# Patient Record
Sex: Male | Born: 1985 | Race: White | Hispanic: No | Marital: Married
Health system: Southern US, Community
[De-identification: ages and names within clinical notes are randomized; demographics above are authoritative.]

---

## 2008-03-01 ENCOUNTER — Ambulatory Visit: Payer: Self-pay | Admitting: Pediatrics

## 2009-11-29 IMAGING — CT CT HEAD WITHOUT CONTRAST
2 series · 16 of 30 positions shown, 20 images · non-contrast
Comparison: none

REASON FOR EXAM: Headache
         Call report to: [phone]
COMMENTS:

[Series 2: without · axial · non-contrast · 0.47mm/px · z∈[+294,+428]mm · 13 of 33 slices shown, 17 images]
[im 3/33  brain]
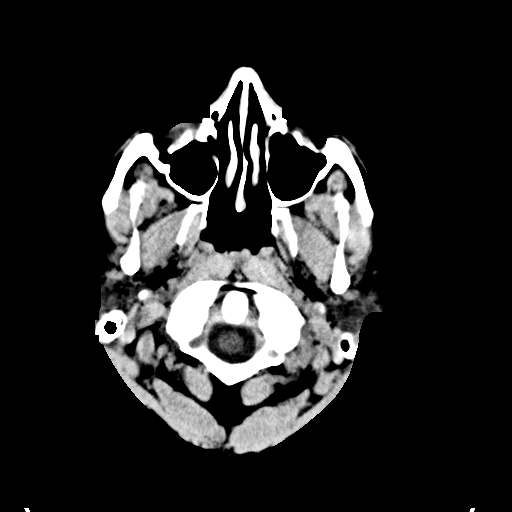
[im 3/33  bone]
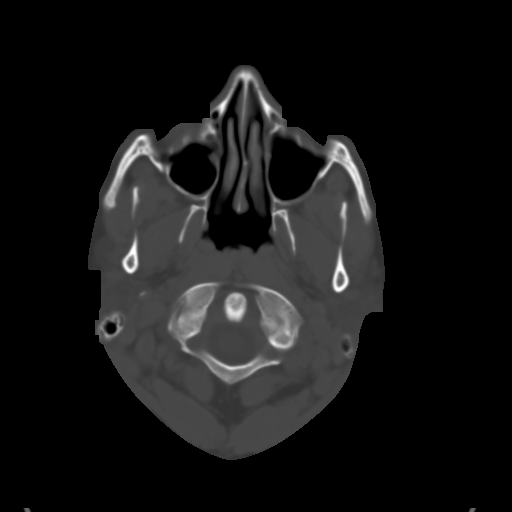
[im 5/33  brain]
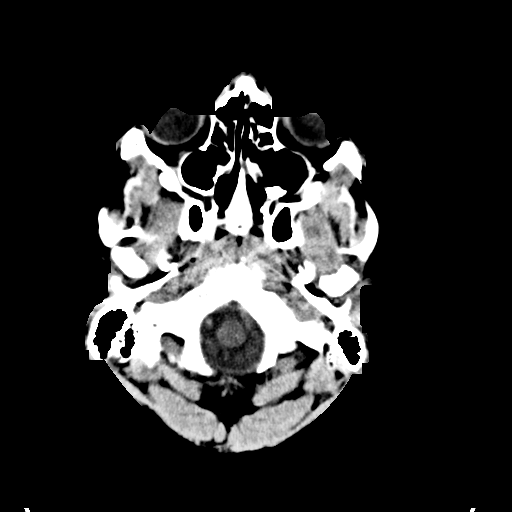
[im 7/33  brain]
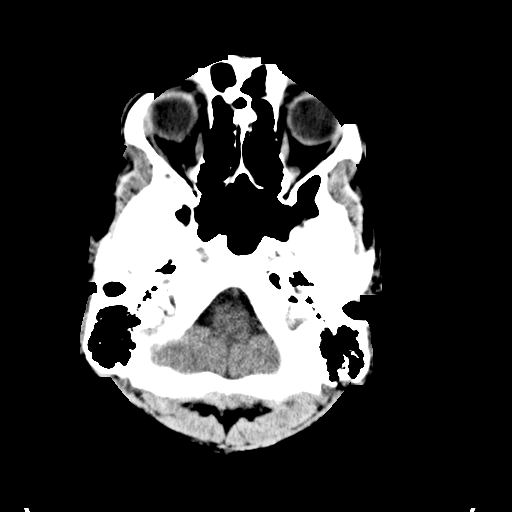
[im 10/33  brain]
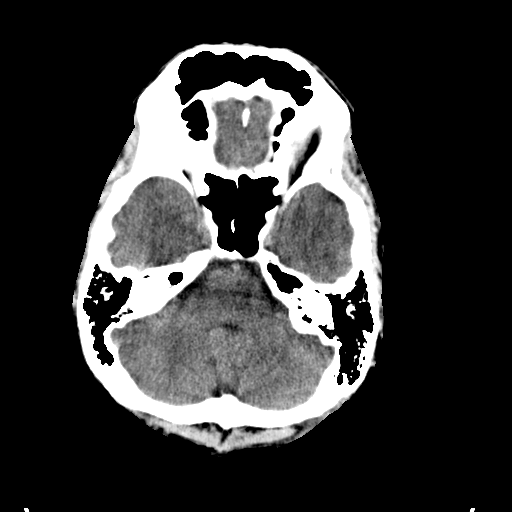
[im 12/33  brain]
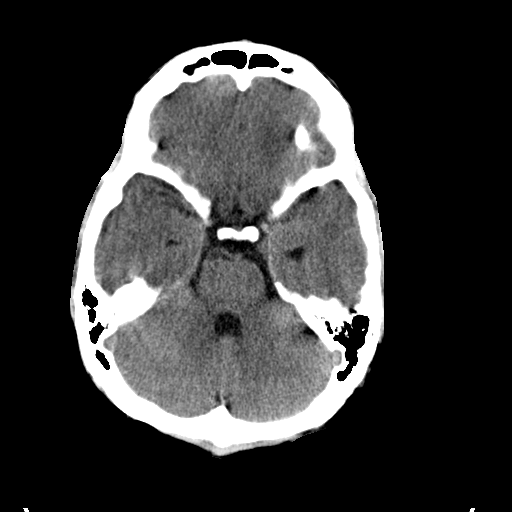
[im 12/33  bone]
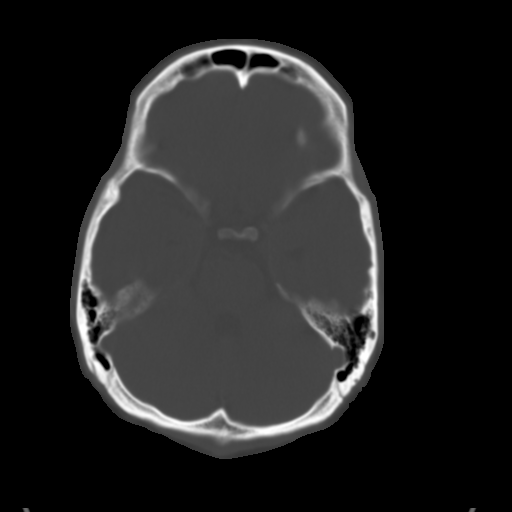
[im 14/33  brain]
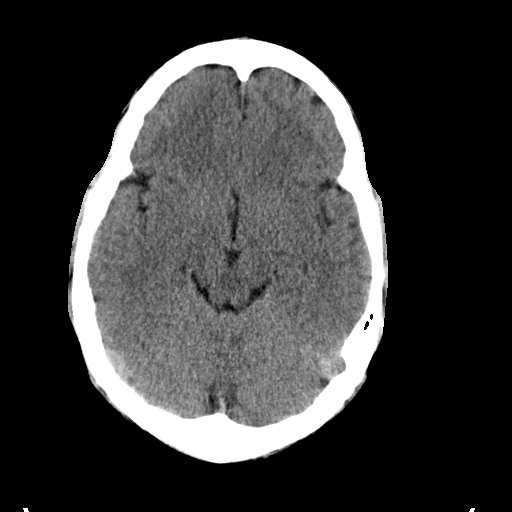
[im 17/33  brain]
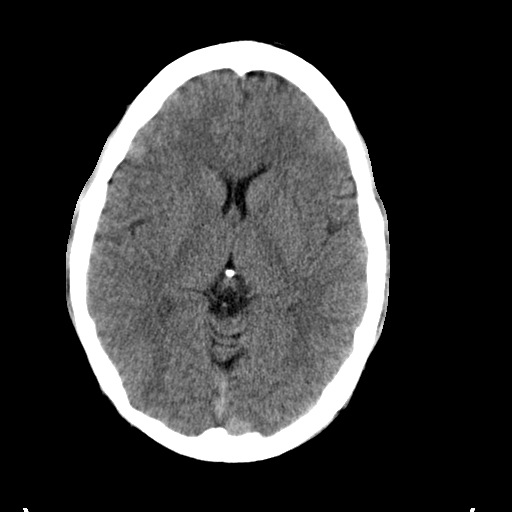
[im 19/33  brain]
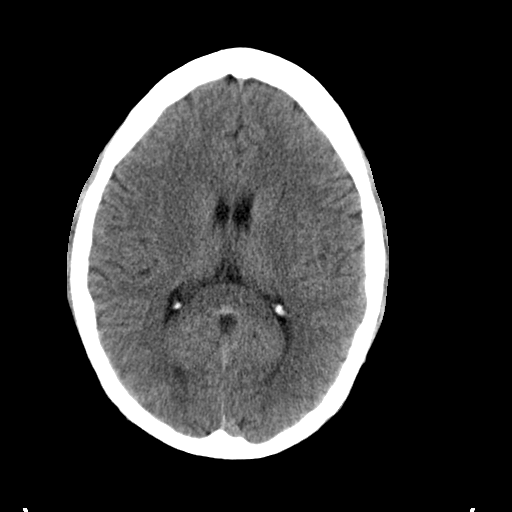
[im 21/33  brain]
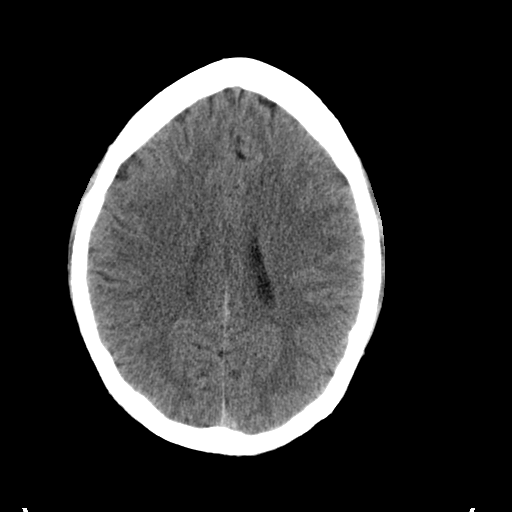
[im 21/33  bone]
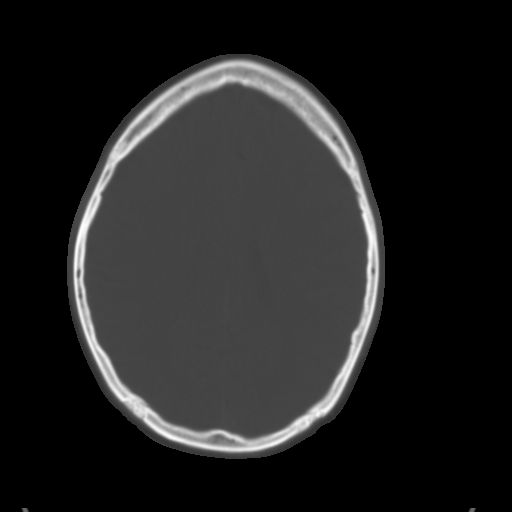
[im 23/33  brain]
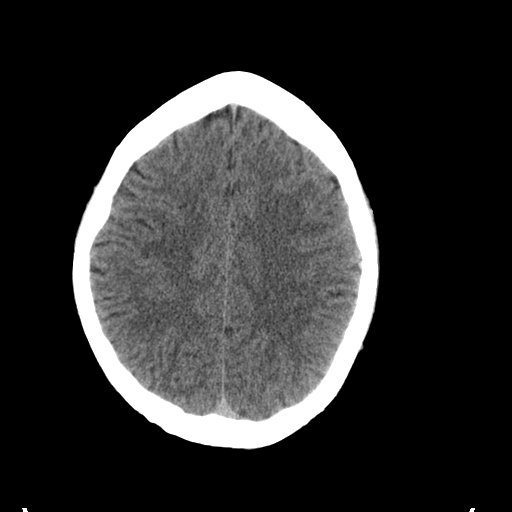
[im 26/33  brain]
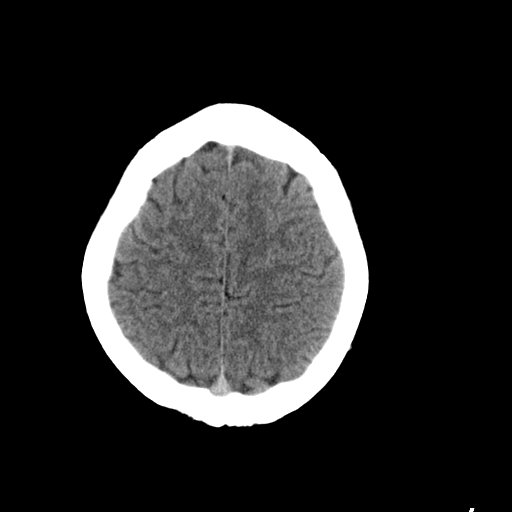
[im 28/33  brain]
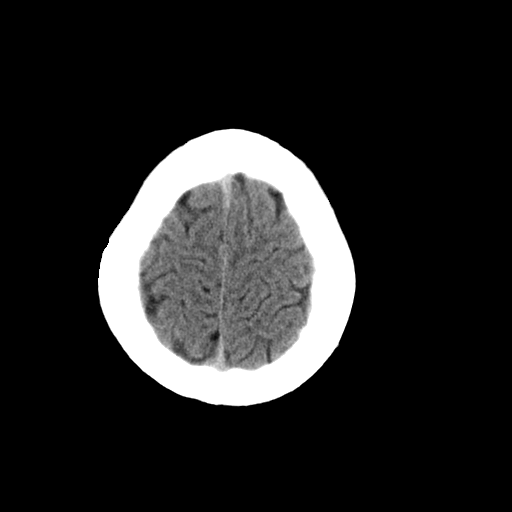
[im 30/33  brain]
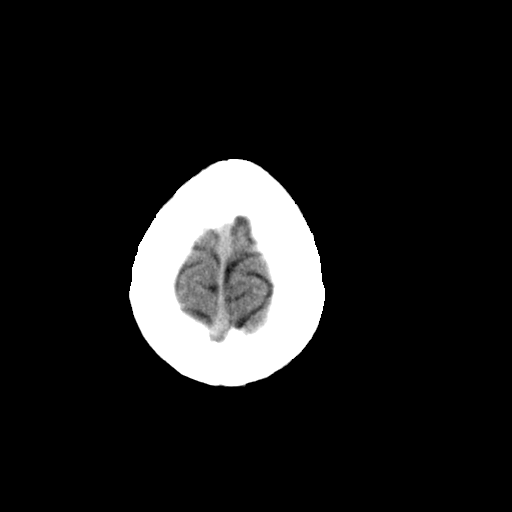
[im 30/33  bone]
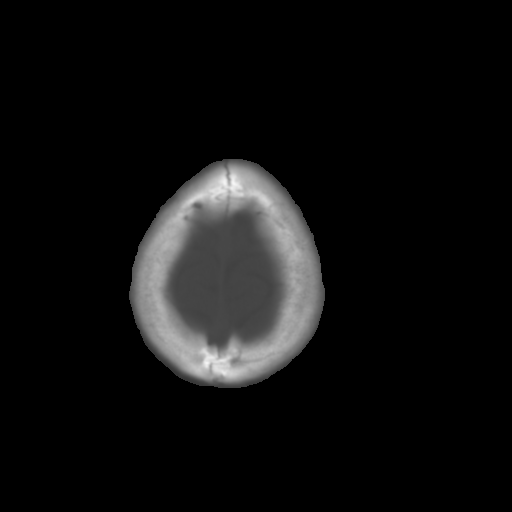

[Series 3: bone · axial · 0.47mm/px · z∈[+294,+338]mm · 3 of 33 slices shown]
[im 3/33  bone]
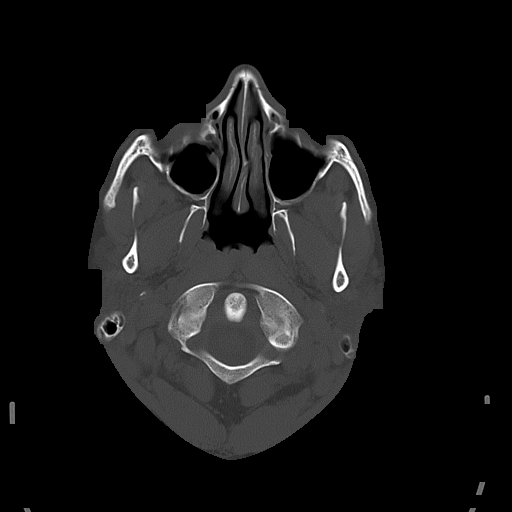
[im 7/33  bone]
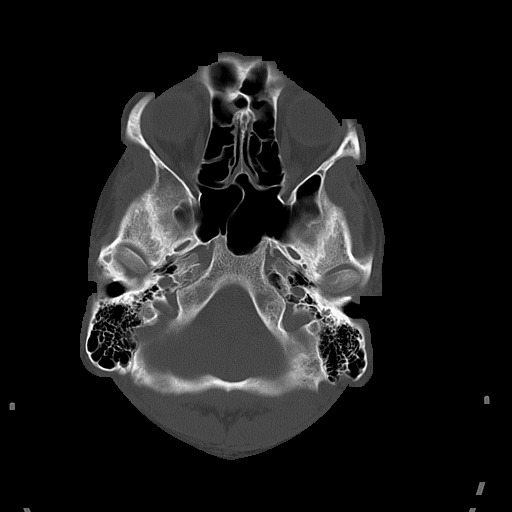
[im 12/33  bone]
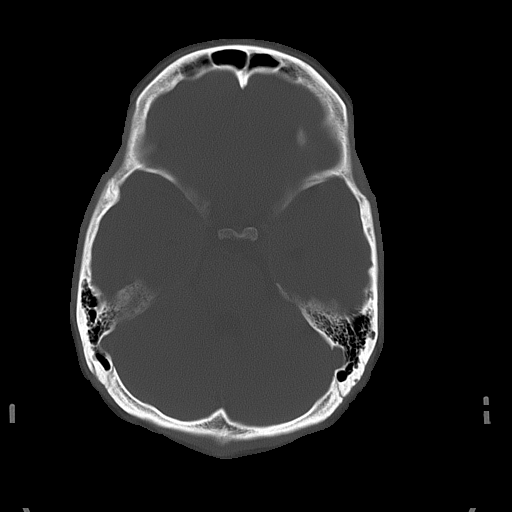

[16 of 30 positions shown; findings below may reference images not displayed]

PROCEDURE:     CT  - CT HEAD WITHOUT CONTRAST  - March 01, 2008  [DATE]

RESULT:     There is no evidence of intra-axial or extra-axial fluid
collections. There is no evidence of acute hemorrhage or secondary signs
reflecting mass effect or subacute or chronic infarction. The visualized
bony skeleton is evaluated and there is no evidence of depressed skull
fracture or further fracture or dislocation. The visualized mastoid air
cells are clear. The ventricles, cisterns and sulci are symmetric and
patent.
IMPRESSION: 1.     No evidence of acute intracranial abnormalities as described above.
2.     If there is persistent clinical concern, further evaluation with
brain MRI is recommended, if clinically warranted.

## 2017-02-18 DIAGNOSIS — R002 Palpitations: Secondary | ICD-10-CM | POA: Insufficient documentation

## 2022-03-01 NOTE — Progress Notes (Unsigned)
[name] [address] [place] [address] Phone: [phone] Subjective:   Bruce Donath, am serving as a scribe for Dr. Antoine Primas.  I'm seeing this patient by the request  of:  Pcp, No  CC: Low back pain  [id number]  Brendan [name] is a 36 y.o. male coming in with complaint of back pain. Patient states that he is having R sided thoracic spine pain. Two weeks ago he was exercising and his pain progressively got worse. Pain has improved but is still present. Using TENS and trying to work on [employer].      Social History   Socioeconomic History   Marital status: Married    Spouse name: Not on file   Number of children: Not on file   Years of education: Not on file   Highest education level: Not on file  Occupational History   Not on file  Tobacco Use   Smoking status: Not on file   Smokeless tobacco: Not on file  Substance and Sexual Activity   Alcohol use: Not on file   Drug use: Not on file   Sexual activity: Not on file  Other Topics Concern   Not on file  Social History Narrative   Not on file   Social Determinants of Health   Financial Resource Strain: Not on file  Food Insecurity: Not on file  Transportation Needs: Not on file  Physical Activity: Not on file  Stress: Not on file  Social Connections: Not on file   Not on File No family history on file.   Current Outpatient Medications (Cardiovascular):    sildenafil (VIAGRA) 100 MG tablet, Take by mouth.     Current Outpatient Medications (Other):    tiZANidine (ZANAFLEX) 2 MG tablet, Take 1 tablet (2 mg total) by mouth at bedtime as needed for muscle spasms.    Reviewed prior external information including notes and imaging from  primary care provider As well as notes that were available from care everywhere and other healthcare systems.  Past medical history, social, surgical and family history all reviewed in electronic medical record.  No  pertanent information unless stated regarding to the chief complaint.   Review of Systems:  No headache, visual changes, nausea, vomiting, diarrhea, constipation, dizziness, abdominal pain, skin rash, fevers, chills, night sweats, weight loss, swollen lymph nodes, body aches, joint swelling, chest pain, shortness of breath, mood changes. POSITIVE muscle aches  Objective  Blood pressure 122/76, pulse 61, height 6\' 1"  (1.854 m), SpO2 99 %.   General: No apparent distress alert and oriented x3 mood and affect normal, dressed appropriately.  HEENT: Pupils equal, extraocular movements intact  Respiratory: Patient's speak in full sentences and does not appear short of breath  Cardiovascular: No lower extremity edema, non tender, no erythema  Patient does have some loss of lordosis of the lumbar spine.  Tightness noted in the paraspinal musculature right greater than left.  Tightness with straight leg test but no true radicular symptoms.  Patient has difficulty with flexion extension.  No midline tenderness.  5 out of 5 strength of the lower extremities  Osteopathic findings C4 flexed rotated and side bent left C6 flexed rotated and side bent left T8 extended rotated and side bent left inhaled third rib T9 extended rotated and side bent left L2 flexed rotated and side bent right Sacrum right on right  [id number]; 15 additional minutes spent for Therapeutic exercises as stated in above notes.  This included  exercises focusing on stretching, strengthening, with significant focus on eccentric aspects.   Long term goals include an improvement in range of motion, strength, endurance as well as avoiding reinjury. Patient's frequency would include in 1-2 times a day, 3-5 times a week for a duration of 6-12 weeks. Low back exercises that included:  Pelvic tilt/bracing instruction to focus on control of the pelvic girdle and lower abdominal muscles  Glute strengthening exercises, focusing on proper firing of the  glutes without engaging the low back muscles Proper stretching techniques for maximum relief for the hamstrings, hip flexors, low back and some rotation where tolerated  Proper technique shown and discussed handout in great detail with ATC.  All questions were discussed and answered.     Impression and Recommendations:    The above documentation has been reviewed and is accurate and complete Judi Saa, DO

## 2022-03-02 ENCOUNTER — Ambulatory Visit (INDEPENDENT_AMBULATORY_CARE_PROVIDER_SITE_OTHER): Payer: BC Managed Care – PPO | Admitting: Family Medicine

## 2022-03-02 VITALS — BP 122/76 | HR 61 | Ht 73.0 in

## 2022-03-02 DIAGNOSIS — M24551 Contracture, right hip: Secondary | ICD-10-CM | POA: Diagnosis not present

## 2022-03-02 DIAGNOSIS — M9903 Segmental and somatic dysfunction of lumbar region: Secondary | ICD-10-CM

## 2022-03-02 DIAGNOSIS — M9904 Segmental and somatic dysfunction of sacral region: Secondary | ICD-10-CM | POA: Diagnosis not present

## 2022-03-02 DIAGNOSIS — M9901 Segmental and somatic dysfunction of cervical region: Secondary | ICD-10-CM | POA: Diagnosis not present

## 2022-03-02 DIAGNOSIS — M9902 Segmental and somatic dysfunction of thoracic region: Secondary | ICD-10-CM

## 2022-03-02 DIAGNOSIS — M9908 Segmental and somatic dysfunction of rib cage: Secondary | ICD-10-CM | POA: Diagnosis not present

## 2022-03-02 MED ORDER — TIZANIDINE HCL 2 MG PO TABS: 2.0000 mg | ORAL_TABLET | Freq: Every evening | ORAL | 0 refills | Status: AC | PRN

## 2022-03-02 NOTE — Assessment & Plan Note (Signed)
More likely hip flexor tightness.  Patient has no other systemic problems.  Seems to be tight on the right side more than the left.  Given Zanaflex to take at bedtime.  New problem overall but did respond well to osteopathic manipulation.  Follow-up again in 6 weeks.  Work with Event organiser to learn home exercises.

## 2022-03-02 NOTE — Assessment & Plan Note (Signed)

## 2022-03-02 NOTE — Patient Instructions (Signed)
Do prescribed exercises at least 3x a week: View at www.my-exercise-code.com using code: [id number]  Zanaflex 2mg  for next 3 nights then as needed 3 ibuprofen 3x a day for 3 days See you again in 5-6 weeks

## 2022-04-06 NOTE — Progress Notes (Unsigned)
  [name] [address] [place] [address] Phone: [phone] Subjective:   Brendan [name], am serving as a scribe for Dr. Antoine Primas.   CC: back and neck pain   [name]  Brendan [name] is a 36 y.o. male coming in with complaint of back and neck pain. OMT 8/1/12023. Patient states doing well. Feels much better since last visit. No new complaints.   Medications patient has been prescribed: Zanaflex  Taking:         Review of Systems:  No headache, visual changes, nausea, vomiting, diarrhea, constipation, dizziness, abdominal pain, skin rash, fevers, chills, night sweats, weight loss, swollen lymph nodes, body aches, joint swelling, chest pain, shortness of breath, mood changes. POSITIVE muscle aches  Objective  Blood pressure 122/78, pulse 64, height 6\' 1"  (1.854 m), weight 194 lb (88 kg), SpO2 98 %.   General: No apparent distress alert and oriented x3 mood and affect normal, dressed appropriately.  HEENT: Pupils equal, extraocular movements intact  Respiratory: Patient's speak in full sentences and does not appear short of breath  Cardiovascular: No lower extremity edema, non tender, no erythema  Gait MSK:  Back low back exam does have loss   Osteopathic findings   T3 extended rotated and side bent righ L2 flexed rotated and side bent right Sacrum right on right       Assessment and Plan:  Hip flexor tightness, right Continues to have tightness but very.  Continues to make improvement.  Does have a low-dose of Zanaflex for breakthrough pain.  Follow-up again 2 to 3 months.    Nonallopathic problems  Decision today to treat with OMT was based on Physical Exam  After verbal consent patient was treated with HVLA, ME, FPR techniques in  thoracic, lumbar, and sacral  areas  Patient tolerated the procedure well with improvement in symptoms  Patient given exercises, stretches and lifestyle modifications  See  medications in patient instructions if given  Patient will follow up in 4-8 weeks    The above documentation has been reviewed and is accurate and complete , DO          Note: This dictation was prepared with Dragon dictation along with smaller phrase technology. Any transcriptional errors that result from this process are unintentional.

## 2022-04-12 ENCOUNTER — Ambulatory Visit: Payer: BC Managed Care – PPO | Admitting: Family Medicine

## 2022-04-12 VITALS — BP 122/78 | HR 64 | Ht 73.0 in | Wt 194.0 lb

## 2022-04-12 DIAGNOSIS — M24551 Contracture, right hip: Secondary | ICD-10-CM | POA: Diagnosis not present

## 2022-04-12 DIAGNOSIS — M9904 Segmental and somatic dysfunction of sacral region: Secondary | ICD-10-CM

## 2022-04-12 DIAGNOSIS — M9902 Segmental and somatic dysfunction of thoracic region: Secondary | ICD-10-CM | POA: Diagnosis not present

## 2022-04-12 DIAGNOSIS — M9903 Segmental and somatic dysfunction of lumbar region: Secondary | ICD-10-CM | POA: Diagnosis not present

## 2022-04-12 NOTE — Patient Instructions (Addendum)
Good to see you! Keep kicking butt

## 2022-04-12 NOTE — Assessment & Plan Note (Signed)
Continues to have tightness but very.  Continues to make improvement.  Does have a low-dose of Zanaflex for breakthrough pain.  Follow-up again 2 to 3 months.

## 2022-06-09 NOTE — Progress Notes (Signed)
  [name] [address] [place] [address] Phone: [phone] Subjective:    I'm seeing this patient by the request  of:  Pcp, No  CC: Low back pain follow-up  [id number]  Brendan [name] is a 36 y.o. male coming in with complaint of back and neck pain. OMT 04/12/2022. Patient states that he has been doing well.  Very minimal discomfort at all.  Patient has not been using the muscle relaxer on a regular basis.  Patient states that he continues to have some discomfort but no pain.  Medications patient has been prescribed: Zanaflex  Taking:         Reviewed prior external information including notes and imaging from previsou exam, outside providers and external EMR if available.   As well as notes that were available from care everywhere and other healthcare systems.  Past medical history, social, surgical and family history all reviewed in electronic medical record.  No pertanent information unless stated regarding to the chief complaint.   No past medical history on file.  Not on File   Review of Systems:  No headache, visual changes, nausea, vomiting, diarrhea, constipation, dizziness, abdominal pain, skin rash, fevers, chills, night sweats, weight loss, swollen lymph nodes, body aches, joint swelling, chest pain, shortness of breath, mood changes. POSITIVE muscle aches  Objective  Blood pressure 102/66, pulse 68, height 6\' 1"  (1.854 m), weight 196 lb (88.9 kg), SpO2 97 %.   General: No apparent distress alert and oriented x3 mood and affect normal, dressed appropriately.  HEENT: Pupils equal, extraocular movements intact  Respiratory: Patient's speak in full sentences and does not appear short of breath  Cardiovascular: No lower extremity edema, non tender, no erythema  Low back pain is noted with some very mild tenderness.  Still has tightness in the right hip flexor compared to the left side.  No numbness or tingling though  noted.  Osteopathic findings  C3 flexed rotated and side bent right C7 flexed rotated and side bent left T3 extended rotated and side bent right inhaled rib T9 extended rotated and side bent left L2 flexed rotated and side bent right Sacrum right on right       Assessment and Plan:  Hip flexor tightness, right Continue aspirin nothing severe.  Has made significant progress.  Patient is doing home exercises and follow-up with me again in 3 months    Nonallopathic problems  Decision today to treat with OMT was based on Physical Exam  After verbal consent patient was treated with HVLA, ME, FPR techniques in cervical, rib, thoracic, lumbar, and sacral  areas  Patient tolerated the procedure well with improvement in symptoms  Patient given exercises, stretches and lifestyle modifications  See medications in patient instructions if given  Patient will follow up in 4-8 weeks     The above documentation has been reviewed and is accurate and complete , DO         Note: This dictation was prepared with Dragon dictation along with smaller phrase technology. Any transcriptional errors that result from this process are unintentional.

## 2022-06-15 ENCOUNTER — Ambulatory Visit: Payer: BC Managed Care – PPO | Admitting: Family Medicine

## 2022-06-15 VITALS — BP 102/66 | HR 68 | Ht 73.0 in | Wt 196.0 lb

## 2022-06-15 DIAGNOSIS — M9904 Segmental and somatic dysfunction of sacral region: Secondary | ICD-10-CM

## 2022-06-15 DIAGNOSIS — M9908 Segmental and somatic dysfunction of rib cage: Secondary | ICD-10-CM | POA: Diagnosis not present

## 2022-06-15 DIAGNOSIS — M9903 Segmental and somatic dysfunction of lumbar region: Secondary | ICD-10-CM

## 2022-06-15 DIAGNOSIS — M9901 Segmental and somatic dysfunction of cervical region: Secondary | ICD-10-CM | POA: Diagnosis not present

## 2022-06-15 DIAGNOSIS — M24551 Contracture, right hip: Secondary | ICD-10-CM

## 2022-06-15 DIAGNOSIS — M9902 Segmental and somatic dysfunction of thoracic region: Secondary | ICD-10-CM

## 2022-06-15 NOTE — Patient Instructions (Signed)
See me in 3 months

## 2022-06-15 NOTE — Assessment & Plan Note (Signed)
Continue aspirin nothing severe.  Has made significant progress.  Patient is doing home exercises and follow-up with me again in 3 months

## 2022-09-10 NOTE — Progress Notes (Deleted)
  [facility] Phone: [phone] Subjective:    I'm seeing this patient by the request  of:  Pcp, No  CC:   [id number]  Raynold Blankenbaker is a 37 y.o. male coming in with complaint of back and neck pain. OMT 06/15/2022. Patient states   Medications patient has been prescribed: None  Taking:         Reviewed prior external information including notes and imaging from previsou exam, outside providers and external EMR if available.   As well as notes that were available from care everywhere and other healthcare systems.  Past medical history, social, surgical and family history all reviewed in electronic medical record.  No pertanent information unless stated regarding to the chief complaint.   No past medical history on file.  Not on File   Review of Systems:  No headache, visual changes, nausea, vomiting, diarrhea, constipation, dizziness, abdominal pain, skin rash, fevers, chills, night sweats, weight loss, swollen lymph nodes, body aches, joint swelling, chest pain, shortness of breath, mood changes. POSITIVE muscle aches  Objective  There were no vitals taken for this visit.   General: No apparent distress alert and oriented x3 mood and affect normal, dressed appropriately.  HEENT: Pupils equal, extraocular movements intact  Respiratory: Patient's speak in full sentences and does not appear short of breath  Cardiovascular: No lower extremity edema, non tender, no erythema  Gait MSK:  Back   Osteopathic findings  C2 flexed rotated and side bent right C6 flexed rotated and side bent left T3 extended rotated and side bent right inhaled rib T9 extended rotated and side bent left L2 flexed rotated and side bent right Sacrum right on right       Assessment and Plan:  No problem-specific Assessment & Plan notes found for this encounter.    Nonallopathic problems  Decision today to treat  with OMT was based on Physical Exam  After verbal consent patient was treated with HVLA, ME, FPR techniques in cervical, rib, thoracic, lumbar, and sacral  areas  Patient tolerated the procedure well with improvement in symptoms  Patient given exercises, stretches and lifestyle modifications  See medications in patient instructions if given  Patient will follow up in 4-8 weeks             Note: This dictation was prepared with Dragon dictation along with smaller phrase technology. Any transcriptional errors that result from this process are unintentional.

## 2022-09-14 ENCOUNTER — Ambulatory Visit: Payer: BC Managed Care – PPO | Admitting: Family Medicine

## 2022-09-14 NOTE — Progress Notes (Unsigned)
  [facility] [place] [facility] [place] Phone: [phone] Subjective:   Brendan [name], am serving as a scribe for Dr. Hulan Saas.  I'm seeing this patient by the request  of:  Pcp, [name]  CC: Low back pain follow-up  [id number]  Brendan [name] is a 37 y.o. male coming in with complaint of back and neck pain. OMT 06/15/2022. Patient states that L side of lumbar spine has been tight due to lifting his kids.  Patient states that overall he is doing a little better.  Patient was doing well until this recent exacerbation.  [name] radiation.  Seems to be a little bit more left than his usual but still has the tightness on the right side has not taken the Zanaflex anytime recently           Reviewed prior external information including notes and imaging from previsou exam, outside providers and external EMR if available.   As well as notes that were available from care everywhere and other healthcare systems.  Past medical history, social, surgical and family history all reviewed in electronic medical record.  [name] pertanent information unless stated regarding to the chief complaint.   [name] past medical history on file.  Not on File   Review of Systems:  [name] headache, visual changes, nausea, vomiting, diarrhea, constipation, dizziness, abdominal pain, skin rash, fevers, chills, night sweats, weight loss, swollen lymph nodes, body aches, joint swelling, chest pain, shortness of breath, mood changes. POSITIVE muscle aches  Objective  Blood pressure 110/70, pulse 76, height 6\' 1"  (1.854 m), weight 190 lb (86.2 kg), SpO2 98 %.   General: [name] apparent distress alert and oriented x3 mood and affect normal, dressed appropriately.  HEENT: Pupils equal, extraocular movements intact  Respiratory: Patient's speak in full sentences and does not appear short of breath  Cardiovascular: [name] lower extremity edema, non tender, [name] erythema  Gait MSK:  Back does  have some loss of lordosis noted.  Some tenderness to palpation in the paraspinal musculature right greater than left.  Osteopathic findings   T9 extended rotated and side bent left L2 flexed rotated and side bent right L4 flexed rotated and side bent left Sacrum right on right       Assessment and Plan:  Hip flexor tightness, right Significant improvement in range of motion noted.  Discussed icing regimen and home exercises.  Discussed which activities to do and which ones to avoid.  Increase activity slowly.  Discussed icing regimen and home exercises if needed but overall I do believe the patient is doing well with conservative therapy.  We did discuss with patient we can follow-up again in 3 to 4 months to further evaluate.    Nonallopathic problems  Decision today to treat with OMT was based on Physical Exam  After verbal consent patient was treated with HVLA, ME, FPR techniques in , thoracic, lumbar, and sacral  areas  Patient tolerated the procedure well with improvement in symptoms  Patient given exercises, stretches and lifestyle modifications  See medications in patient instructions if given  Patient will follow up in 4-8 weeks    The above documentation has been reviewed and is accurate and complete Brendan Pulley, DO          Note: This dictation was prepared with Dragon dictation along with smaller phrase technology. Any transcriptional errors that result from this process are unintentional.

## 2022-09-15 ENCOUNTER — Encounter: Payer: Self-pay | Admitting: Family Medicine

## 2022-09-15 ENCOUNTER — Ambulatory Visit: Payer: BC Managed Care – PPO | Admitting: Family Medicine

## 2022-09-15 VITALS — BP 110/70 | HR 76 | Ht 73.0 in | Wt 190.0 lb

## 2022-09-15 DIAGNOSIS — M9903 Segmental and somatic dysfunction of lumbar region: Secondary | ICD-10-CM | POA: Diagnosis not present

## 2022-09-15 DIAGNOSIS — M9904 Segmental and somatic dysfunction of sacral region: Secondary | ICD-10-CM

## 2022-09-15 DIAGNOSIS — M9902 Segmental and somatic dysfunction of thoracic region: Secondary | ICD-10-CM | POA: Diagnosis not present

## 2022-09-15 DIAGNOSIS — M24551 Contracture, right hip: Secondary | ICD-10-CM | POA: Diagnosis not present

## 2022-09-15 NOTE — Patient Instructions (Signed)
Still doing awesome  [place] See me in 3-4 months

## 2022-09-15 NOTE — Assessment & Plan Note (Signed)
Significant improvement in range of motion noted.  Discussed icing regimen and home exercises.  Discussed which activities to do and which ones to avoid.  Increase activity slowly.  Discussed icing regimen and home exercises if needed but overall I do believe the patient is doing well with conservative therapy.  We did discuss with patient we can follow-up again in 3 to 4 months to further evaluate.

## 2022-12-09 NOTE — Progress Notes (Signed)
  [name] [address] [place] [address] Phone: [phone] Subjective:   Brendan [name], am serving as a scribe for Brendan [name].  I'm seeing this patient by the request  of:  Pcp, No  CC: Right hip flexor follow-up  [id number]  Brendan [name] is a 37 y.o. male coming in with complaint of back and neck pain. OMT 09/15/2022. Patient states that he is doing well. No issues since last visit.   Medications patient has been prescribed: None  Taking:         Reviewed prior external information including notes and imaging from previsou exam, outside providers and external EMR if available.   As well as notes that were available from care everywhere and other healthcare systems.  Past medical history, social, surgical and family history all reviewed in electronic medical record.  No pertanent information unless stated regarding to the chief complaint.   No past medical history on file.  Not on File   Review of Systems:  No headache, visual changes, nausea, vomiting, diarrhea, constipation, dizziness, abdominal pain, skin rash, fevers, chills, night sweats, weight loss, swollen lymph nodes, body aches, joint swelling, chest pain, shortness of breath, mood changes. POSITIVE muscle aches  Objective  Blood pressure (!) 132/92, pulse 62, height 6\' 1"  (1.854 m), weight 186 lb (84.4 kg), SpO2 98 %.   General: No apparent distress alert and oriented x3 mood and affect normal, dressed appropriately.  HEENT: Pupils equal, extraocular movements intact  Respiratory: Patient's speak in full sentences and does not appear short of breath  Cardiovascular: No lower extremity edema, non tender, no erythema  Low back does have tightness noted in the paraspinal musculature of the lumbar spine.  Does have tightness noted and appears  Osteopathic findings  C3 flexed rotated and side bent right C7 flexed rotated and side bent left T3 extended  rotated and side bent right inhaled rib T8 extended rotated and side bent left L2 flexed rotated and side bent right Sacrum right on right       Assessment and Plan:  Hip flexor tightness, right Tightness noted of the hip flexor.  Discussed with patient about icing regimen and home exercises, discussed which activities to do with hip abductor strengthening.  Patient is to continue to work on this.  Likely no patient has made great strides already and will follow-up with me again in 3 to 4 months.    Nonallopathic problems  Decision today to treat with OMT was based on Physical Exam  After verbal consent patient was treated with HVLA, ME, FPR techniques in cervical, rib, thoracic, lumbar, and sacral  areas  Patient tolerated the procedure well with improvement in symptoms  Patient given exercises, stretches and lifestyle modifications  See medications in patient instructions if given  Patient will follow up in 4-8 weeks    The above documentation has been reviewed and is accurate and complete Brendan Saa, DO          Note: This dictation was prepared with Dragon dictation along with smaller phrase technology. Any transcriptional errors that result from this process are unintentional.

## 2022-12-15 ENCOUNTER — Ambulatory Visit: Payer: Managed Care, Other (non HMO) | Admitting: Family Medicine

## 2022-12-15 VITALS — BP 132/92 | HR 62 | Ht 73.0 in | Wt 186.0 lb

## 2022-12-15 DIAGNOSIS — M9904 Segmental and somatic dysfunction of sacral region: Secondary | ICD-10-CM

## 2022-12-15 DIAGNOSIS — M9903 Segmental and somatic dysfunction of lumbar region: Secondary | ICD-10-CM | POA: Diagnosis not present

## 2022-12-15 DIAGNOSIS — M9901 Segmental and somatic dysfunction of cervical region: Secondary | ICD-10-CM | POA: Diagnosis not present

## 2022-12-15 DIAGNOSIS — M24551 Contracture, right hip: Secondary | ICD-10-CM | POA: Diagnosis not present

## 2022-12-15 DIAGNOSIS — M9902 Segmental and somatic dysfunction of thoracic region: Secondary | ICD-10-CM

## 2022-12-15 DIAGNOSIS — M9908 Segmental and somatic dysfunction of rib cage: Secondary | ICD-10-CM | POA: Diagnosis not present

## 2022-12-15 NOTE — Assessment & Plan Note (Signed)
Tightness noted of the hip flexor.  Discussed with patient about icing regimen and home exercises, discussed which activities to do with hip abductor strengthening.  Patient is to continue to work on this.  Likely no patient has made great strides already and will follow-up with me again in 3 to 4 months.

## 2022-12-15 NOTE — Patient Instructions (Signed)
Good to see you Have a great summer See me in 3-4 months

## 2023-03-17 NOTE — Progress Notes (Signed)
  [name] [address] [place] [address] Phone: [phone] Subjective:   INadine Counts, am serving as a scribe for Dr. Antoine Primas.  I'm seeing this patient by the request  of:  Pcp, No  CC: Back and neck pain follow-up  [id number]  Brendan [name] is a 37 y.o. male coming in with complaint of back and neck pain. OMT 12/15/2022. Patient states everything is going well. No new concerns.  Medications patient has been prescribed: None  Taking:         Reviewed prior external information including notes and imaging from previsou exam, outside providers and external EMR if available.   As well as notes that were available from care everywhere and other healthcare systems.  Past medical history, social, surgical and family history all reviewed in electronic medical record.  No pertanent information unless stated regarding to the chief complaint.    Objective  Blood pressure 122/76, pulse 69, height 6\' 1"  (1.854 m), weight 187 lb (84.8 kg), SpO2 99%.   General: No apparent distress alert and oriented x3 mood and affect normal, dressed appropriately.  HEENT: Pupils equal, extraocular movements intact  Respiratory: Patient's speak in full sentences and does not appear short of breath  Cardiovascular: No lower extremity edema, non tender, no erythema  Low back has a very mild increase in tightness noted of the hip flexor on the right side.  Nothing severe that is stopping him from activity it appears.  Patient does have some tenderness to palpation  Osteopathic findings  C2 flexed rotated and side bent right C7 flexed rotated and side bent right T3 extended rotated and side bent right inhaled rib T9 extended rotated and side bent left L2 flexed rotated and side bent right Sacrum right on right       Assessment and Plan:  Hip flexor tightness, right Hip flexor tightness seems to be almost nonexistent at this time.  With patient  that does respond extremely well to osteopathic manipulation.  Patient has not had significant trouble.  He would like to follow-up with me again 3 to 4 months otherwise.    Nonallopathic problems  Decision today to treat with OMT was based on Physical Exam  After verbal consent patient was treated with HVLA, ME, FPR techniques in cervical, rib, thoracic, lumbar, and sacral  areas  Patient tolerated the procedure well with improvement in symptoms  Patient given exercises, stretches and lifestyle modifications  See medications in patient instructions if given  Patient will follow up in 4-8 weeks    The above documentation has been reviewed and is accurate and complete Judi Saa, DO          Note: This dictation was prepared with Dragon dictation along with smaller phrase technology. Any transcriptional errors that result from this process are unintentional.

## 2023-03-23 ENCOUNTER — Ambulatory Visit: Payer: Managed Care, Other (non HMO) | Admitting: Family Medicine

## 2023-03-23 ENCOUNTER — Encounter: Payer: Self-pay | Admitting: Family Medicine

## 2023-03-23 VITALS — BP 122/76 | HR 69 | Ht 73.0 in | Wt 187.0 lb

## 2023-03-23 DIAGNOSIS — M24551 Contracture, right hip: Secondary | ICD-10-CM | POA: Diagnosis not present

## 2023-03-23 DIAGNOSIS — M9903 Segmental and somatic dysfunction of lumbar region: Secondary | ICD-10-CM | POA: Diagnosis not present

## 2023-03-23 DIAGNOSIS — M9902 Segmental and somatic dysfunction of thoracic region: Secondary | ICD-10-CM | POA: Diagnosis not present

## 2023-03-23 DIAGNOSIS — M9904 Segmental and somatic dysfunction of sacral region: Secondary | ICD-10-CM

## 2023-03-23 DIAGNOSIS — M9901 Segmental and somatic dysfunction of cervical region: Secondary | ICD-10-CM

## 2023-03-23 DIAGNOSIS — M9908 Segmental and somatic dysfunction of rib cage: Secondary | ICD-10-CM | POA: Diagnosis not present

## 2023-03-23 NOTE — Patient Instructions (Signed)
Good to see you! Overall you'll be our poster child See you again in 3-4 months

## 2023-03-23 NOTE — Assessment & Plan Note (Signed)
Hip flexor tightness seems to be almost nonexistent at this time.  With patient that does respond extremely well to osteopathic manipulation.  Patient has not had significant trouble.  He would like to follow-up with me again 3 to 4 months otherwise.

## 2023-08-10 ENCOUNTER — Ambulatory Visit: Payer: Managed Care, Other (non HMO) | Admitting: Family Medicine
